# Patient Record
Sex: Female | Born: 1950 | Race: White | Hispanic: No | Marital: Married | Smoking: Never smoker
Health system: Southern US, Community
[De-identification: ages and names within clinical notes are randomized; demographics above are authoritative.]

## PROBLEM LIST (undated history)

## (undated) DIAGNOSIS — I10 Essential (primary) hypertension: Secondary | ICD-10-CM

## (undated) DIAGNOSIS — G4730 Sleep apnea, unspecified: Secondary | ICD-10-CM

## (undated) DIAGNOSIS — E785 Hyperlipidemia, unspecified: Secondary | ICD-10-CM

## (undated) DIAGNOSIS — J189 Pneumonia, unspecified organism: Secondary | ICD-10-CM

## (undated) DIAGNOSIS — J45909 Unspecified asthma, uncomplicated: Secondary | ICD-10-CM

## (undated) HISTORY — PX: BREAST SURGERY: SHX581

## (undated) HISTORY — PX: BACK SURGERY: SHX140

---

## 2010-04-16 ENCOUNTER — Emergency Department (HOSPITAL_BASED_OUTPATIENT_CLINIC_OR_DEPARTMENT_OTHER): Admission: EM | Admit: 2010-04-16 | Discharge: 2010-04-16 | Payer: Self-pay | Admitting: Emergency Medicine

## 2016-07-09 ENCOUNTER — Encounter (HOSPITAL_BASED_OUTPATIENT_CLINIC_OR_DEPARTMENT_OTHER): Payer: Self-pay

## 2016-07-09 ENCOUNTER — Emergency Department (HOSPITAL_BASED_OUTPATIENT_CLINIC_OR_DEPARTMENT_OTHER)
Admission: EM | Admit: 2016-07-09 | Discharge: 2016-07-09 | Disposition: A | Discharge status: Other Acute Inpatient Hospital | Payer: Medicare Other | Attending: Emergency Medicine | Admitting: Emergency Medicine

## 2016-07-09 ENCOUNTER — Emergency Department (HOSPITAL_BASED_OUTPATIENT_CLINIC_OR_DEPARTMENT_OTHER): Payer: Medicare Other

## 2016-07-09 DIAGNOSIS — J111 Influenza due to unidentified influenza virus with other respiratory manifestations: Secondary | ICD-10-CM | POA: Insufficient documentation

## 2016-07-09 DIAGNOSIS — R0902 Hypoxemia: Secondary | ICD-10-CM

## 2016-07-09 DIAGNOSIS — J45909 Unspecified asthma, uncomplicated: Secondary | ICD-10-CM | POA: Insufficient documentation

## 2016-07-09 DIAGNOSIS — Z7982 Long term (current) use of aspirin: Secondary | ICD-10-CM | POA: Diagnosis not present

## 2016-07-09 DIAGNOSIS — J189 Pneumonia, unspecified organism: Secondary | ICD-10-CM | POA: Diagnosis not present

## 2016-07-09 DIAGNOSIS — R0602 Shortness of breath: Secondary | ICD-10-CM | POA: Diagnosis present

## 2016-07-09 DIAGNOSIS — Z791 Long term (current) use of non-steroidal anti-inflammatories (NSAID): Secondary | ICD-10-CM | POA: Diagnosis not present

## 2016-07-09 DIAGNOSIS — Z79899 Other long term (current) drug therapy: Secondary | ICD-10-CM | POA: Insufficient documentation

## 2016-07-09 HISTORY — DX: Unspecified asthma, uncomplicated: J45.909

## 2016-07-09 HISTORY — DX: Sleep apnea, unspecified: G47.30

## 2016-07-09 LAB — BASIC METABOLIC PANEL
ANION GAP: 8 (ref 5–15)
BUN: 24 mg/dL — ABNORMAL HIGH (ref 6–20)
CALCIUM: 8.9 mg/dL (ref 8.9–10.3)
CO2: 33 mmol/L — AB (ref 22–32)
Chloride: 94 mmol/L — ABNORMAL LOW (ref 101–111)
Creatinine, Ser: 0.8 mg/dL (ref 0.44–1.00)
GFR calc non Af Amer: >60 mL/min (ref >60)
Glucose, Bld: 106 mg/dL — ABNORMAL HIGH (ref 65–99)
Potassium: 3.6 mmol/L (ref 3.5–5.1)
Sodium: 135 mmol/L (ref 135–145)

## 2016-07-09 LAB — CBC WITH DIFFERENTIAL/PLATELET
BASOS ABS: 0 10*3/uL (ref 0.0–0.1)
Basophils Relative: 0 %
Eosinophils Absolute: 0.1 10*3/uL (ref 0.0–0.7)
Eosinophils Relative: 1 %
HEMATOCRIT: 38.5 % (ref 36.0–46.0)
HEMOGLOBIN: 11.9 g/dL — AB (ref 12.0–15.0)
Lymphocytes Relative: 7 %
Lymphs Abs: 0.9 10*3/uL (ref 0.7–4.0)
MCH: 30.6 pg (ref 26.0–34.0)
MCHC: 30.9 g/dL (ref 30.0–36.0)
MCV: 99 fL (ref 78.0–100.0)
Monocytes Absolute: 0.6 10*3/uL (ref 0.1–1.0)
Monocytes Relative: 5 %
NEUTROS ABS: 10.9 10*3/uL — AB (ref 1.7–7.7)
NEUTROS PCT: 87 %
Platelets: 249 10*3/uL (ref 150–400)
RBC: 3.89 MIL/uL (ref 3.87–5.11)
RDW: 13.5 % (ref 11.5–15.5)
WBC: 12.6 10*3/uL — ABNORMAL HIGH (ref 4.0–10.5)

## 2016-07-09 LAB — INFLUENZA PANEL BY PCR (TYPE A & B)
INFLAPCR: NEGATIVE
INFLBPCR: NEGATIVE

## 2016-07-09 LAB — I-STAT CG4 LACTIC ACID, ED: Lactic Acid, Venous: 1.09 mmol/L (ref 0.5–1.9)

## 2016-07-09 MED ORDER — SODIUM CHLORIDE 0.9 % IV BOLUS (SEPSIS)
1000.0000 mL | Freq: Once | INTRAVENOUS | Status: AC
  Administered 2016-07-09: 1000 mL via INTRAVENOUS

## 2016-07-09 MED ORDER — ACETAMINOPHEN 500 MG PO TABS
500.0000 mg | ORAL_TABLET | Freq: Once | ORAL | Status: AC
  Administered 2016-07-09: 500 mg via ORAL
  Filled 2016-07-09: qty 1

## 2016-07-09 MED ORDER — OSELTAMIVIR PHOSPHATE 75 MG PO CAPS
75.0000 mg | ORAL_CAPSULE | Freq: Once | ORAL | Status: AC
  Administered 2016-07-09: 75 mg via ORAL
  Filled 2016-07-09: qty 1

## 2016-07-09 MED ORDER — AZITHROMYCIN 500 MG IV SOLR
500.0000 mg | Freq: Once | INTRAVENOUS | Status: AC
  Administered 2016-07-09: 500 mg via INTRAVENOUS

## 2016-07-09 MED ORDER — ALBUTEROL SULFATE (2.5 MG/3ML) 0.083% IN NEBU
5.0000 mg | INHALATION_SOLUTION | Freq: Once | RESPIRATORY_TRACT | Status: AC
  Administered 2016-07-09: 5 mg via RESPIRATORY_TRACT
  Filled 2016-07-09: qty 6

## 2016-07-09 MED ORDER — AZITHROMYCIN 500 MG IV SOLR
INTRAVENOUS | Status: AC
  Filled 2016-07-09: qty 500

## 2016-07-09 MED ORDER — IPRATROPIUM BROMIDE 0.02 % IN SOLN
0.5000 mg | Freq: Once | RESPIRATORY_TRACT | Status: AC
  Administered 2016-07-09: 0.5 mg via RESPIRATORY_TRACT
  Filled 2016-07-09: qty 2.5

## 2016-07-09 MED ORDER — CEFTRIAXONE SODIUM 1 G IJ SOLR
1.0000 g | Freq: Once | INTRAMUSCULAR | Status: AC
  Administered 2016-07-09: 1 g via INTRAVENOUS
  Filled 2016-07-09: qty 10

## 2016-07-09 NOTE — ED Provider Notes (Signed)
[facility] Provider Note   CSN: [id number] Arrival date & time: 07/09/16  1152     History   Chief Complaint Chief Complaint  Patient presents with  . Shortness of Breath    HPI Terri Fowler is a 66 y.o. female.  HPI Patient was sent in from her pulmonologist office. Since her shortness of breath and cough. Has had cough with brown sputum for the last 3-4 days. States began after a sleep study. Had sats of 83% in the pulmonologist office. Has no known fevers but found afebrile here. No sick contacts. No chest pain.   Past Medical History:  Diagnosis Date  . Asthma   . Sleep apnea     There are no active problems to display for this patient.   No past surgical history on file.  OB History    No data available       Home Medications    Prior to Admission medications   Medication Sig Start Date End Date Taking? Authorizing Provider  amLODipine (NORVASC) 10 MG tablet Take 10 mg by mouth daily.   Yes Historical Provider, MD  aspirin EC 81 MG tablet Take 81 mg by mouth daily.   Yes Historical Provider, MD  baclofen (LIORESAL) 10 MG tablet Take 10 mg by mouth 3 (three) times daily.   Yes Historical Provider, MD  estradiol (ESTRACE) 1 MG tablet Take 1 mg by mouth daily.   Yes Historical Provider, MD  furosemide (LASIX) 20 MG tablet Take 20 mg by mouth as needed.   Yes Historical Provider, MD  gabapentin (NEURONTIN) 600 MG tablet Take 600 mg by mouth 2 (two) times daily.   Yes Historical Provider, MD  lamoTRIgine (LAMICTAL) 100 MG tablet Take 100 mg by mouth daily.   Yes Historical Provider, MD  losartan-hydrochlorothiazide (HYZAAR) 100-25 MG tablet Take 1 tablet by mouth daily.   Yes Historical Provider, MD  medroxyPROGESTERone (PROVERA) 2.5 MG tablet Take 2.5 mg by mouth daily.   Yes Historical Provider, MD  meloxicam (MOBIC) 15 MG tablet Take 15 mg by mouth daily.   Yes Historical Provider, MD  oxybutynin (DITROPAN) 5 MG tablet Take 5 mg by mouth at  bedtime.   Yes Historical Provider, MD  pantoprazole (PROTONIX) 40 MG tablet Take 40 mg by mouth daily.   Yes Historical Provider, MD  pramipexole (MIRAPEX) 1.5 MG tablet Take 1.5 mg by mouth at bedtime.   Yes Historical Provider, MD  rosuvastatin (CRESTOR) 20 MG tablet Take 20 mg by mouth daily.   Yes Historical Provider, MD  traZODone (DESYREL) 100 MG tablet Take 100 mg by mouth at bedtime.   Yes Historical Provider, MD  venlafaxine (EFFEXOR) 75 MG tablet Take 75 mg by mouth 2 (two) times daily.   Yes Historical Provider, MD    Family History No family history on file.  Social History Social History  Substance Use Topics  . Smoking status: Not on file  . Smokeless tobacco: Not on file  . Alcohol use Not on file     Allergies   Codeine; Demerol [meperidine]; and Morphine and related   Review of Systems Review of Systems  Constitutional: Positive for chills. Negative for appetite change.  HENT: Positive for congestion.   Respiratory: Positive for cough, shortness of breath and wheezing.   Cardiovascular: Negative for chest pain.  Gastrointestinal: Negative for abdominal pain.  Genitourinary: Negative for dysuria.  Musculoskeletal: Negative for back pain.  Skin: Negative for wound.  Neurological: Negative for numbness.  Hematological: Negative for adenopathy.  Psychiatric/Behavioral: Negative for behavioral problems.     Physical Exam Updated Vital Signs BP 110/60   Pulse 101   Temp 102.2 F (39 C) (Oral)   Resp 23   Ht 4\' 11"  (1.499 m)   Wt 200 lb (90.7 kg)   SpO2 93%   BMI 40.40 kg/m   Physical Exam  Constitutional: She appears well-developed.  HENT:  Head: Normocephalic.  Eyes: EOM are normal.  Neck: No JVD present.  Cardiovascular:  Mild tachycardia  Pulmonary/Chest:  Mildly diffuse harsh breath sounds.  Abdominal: Soft. There is no tenderness.  Musculoskeletal: She exhibits no tenderness.  Neurological: She is alert.  Skin: Skin is warm. Capillary  refill takes less than 2 seconds.     ED Treatments / Results  Labs (all labs ordered are listed, but only abnormal results are displayed) Labs Reviewed  BASIC METABOLIC PANEL - Abnormal; Notable for the following:       Result Value   Chloride 94 (*)    CO2 33 (*)    Glucose, Bld 106 (*)    BUN 24 (*)    All other components within normal limits  CBC WITH DIFFERENTIAL/PLATELET - Abnormal; Notable for the following:    WBC 12.6 (*)    Hemoglobin 11.9 (*)    Neutro Abs 10.9 (*)    All other components within normal limits  INFLUENZA PANEL BY PCR (TYPE A & B)  I-STAT CG4 LACTIC ACID, ED    EKG  EKG Interpretation None       Radiology Dg Chest 2 View  Result Date: 07/09/2016 CLINICAL DATA:  Cough.  Shortness of breath . EXAM: CHEST  2 VIEW COMPARISON:  03/25/2016. FINDINGS: Mediastinum and hilar structures normal heart size stable. Mild bilateral from interstitial prominence. Mild pneumonitis cannot be excluded. No pleural effusion or pneumothorax. Prior cervicothoracic spine fusion. IMPRESSION: Mild bilateral from interstitial prominence. Mild pneumonitis cannot be excluded. Electronically Signed   By: Maisie Fus  Register   On: 07/09/2016 12:24    Procedures Procedures (including critical care time)  Medications Ordered in ED Medications  cefTRIAXone (ROCEPHIN) 1 g in dextrose 5 % 50 mL IVPB (1 g Intravenous New Bag/Given 07/09/16 1408)  azithromycin (ZITHROMAX) 500 mg in dextrose 5 % 250 mL IVPB (not administered)  azithromycin (ZITHROMAX) 500 MG injection (not administered)  albuterol (PROVENTIL) (2.5 MG/3ML) 0.083% nebulizer solution 5 mg (5 mg Nebulization Given 07/09/16 1222)  ipratropium (ATROVENT) nebulizer solution 0.5 mg (0.5 mg Nebulization Given 07/09/16 1222)  acetaminophen (TYLENOL) tablet 500 mg (500 mg Oral Given 07/09/16 1218)  sodium chloride 0.9 % bolus 1,000 mL (0 mLs Intravenous Stopped 07/09/16 1408)  oseltamivir (TAMIFLU) capsule 75 mg (75 mg Oral Given  07/09/16 1421)     Initial Impression / Assessment and Plan / ED Course  I have reviewed the triage vital signs and the nursing notes.  Pertinent labs & imaging results that were available during my care of the patient were reviewed by me and considered in my medical decision making (see chart for details).     Patient shortness of breath and cough. Initially hypoxic with sats in the mid 80s. X-ray showed possible pneumonitis. History of asthma. Does not sound wheezy. Has had fevers with increased sputum production. Will give antibiotics but also will send flu test and empirically treat with initial dose of Tamiflu. Continued mild hypoxia in room air but tolerates 2 L of oxygen well. Will admit to [facility]. Discussed with  Dr.Nsiah  Final Clinical Impressions(s) / ED Diagnoses   Final diagnoses:  Hypoxia  Influenza  Community acquired pneumonia, unspecified laterality    New Prescriptions New Prescriptions   No medications on file     Benjiman Core, MD 07/09/16 1427

## 2016-07-09 NOTE — ED Notes (Signed)
Pt transported to [facility] by [facility]

## 2016-07-09 NOTE — ED Notes (Signed)
Patient transported to X-ray 

## 2016-07-09 NOTE — ED Notes (Signed)
[facility]

## 2016-07-09 NOTE — ED Triage Notes (Signed)
Pt c/o "laryngeal spasm" started 2 days ago after sleep study-sent from pulm office-pt unaware of fever-NAD-presents to triage in w/c

## 2016-07-09 NOTE — ED Notes (Signed)
Attempt to call report nurse not available 

## 2018-06-12 IMAGING — DX DG CHEST 2V
2 series · 2 of 2 positions shown · non-contrast
Comparison: 03/25/2016.

CLINICAL DATA: Cough.  Shortness of breath .

EXAM:
CHEST  2 VIEW

[chest pa]
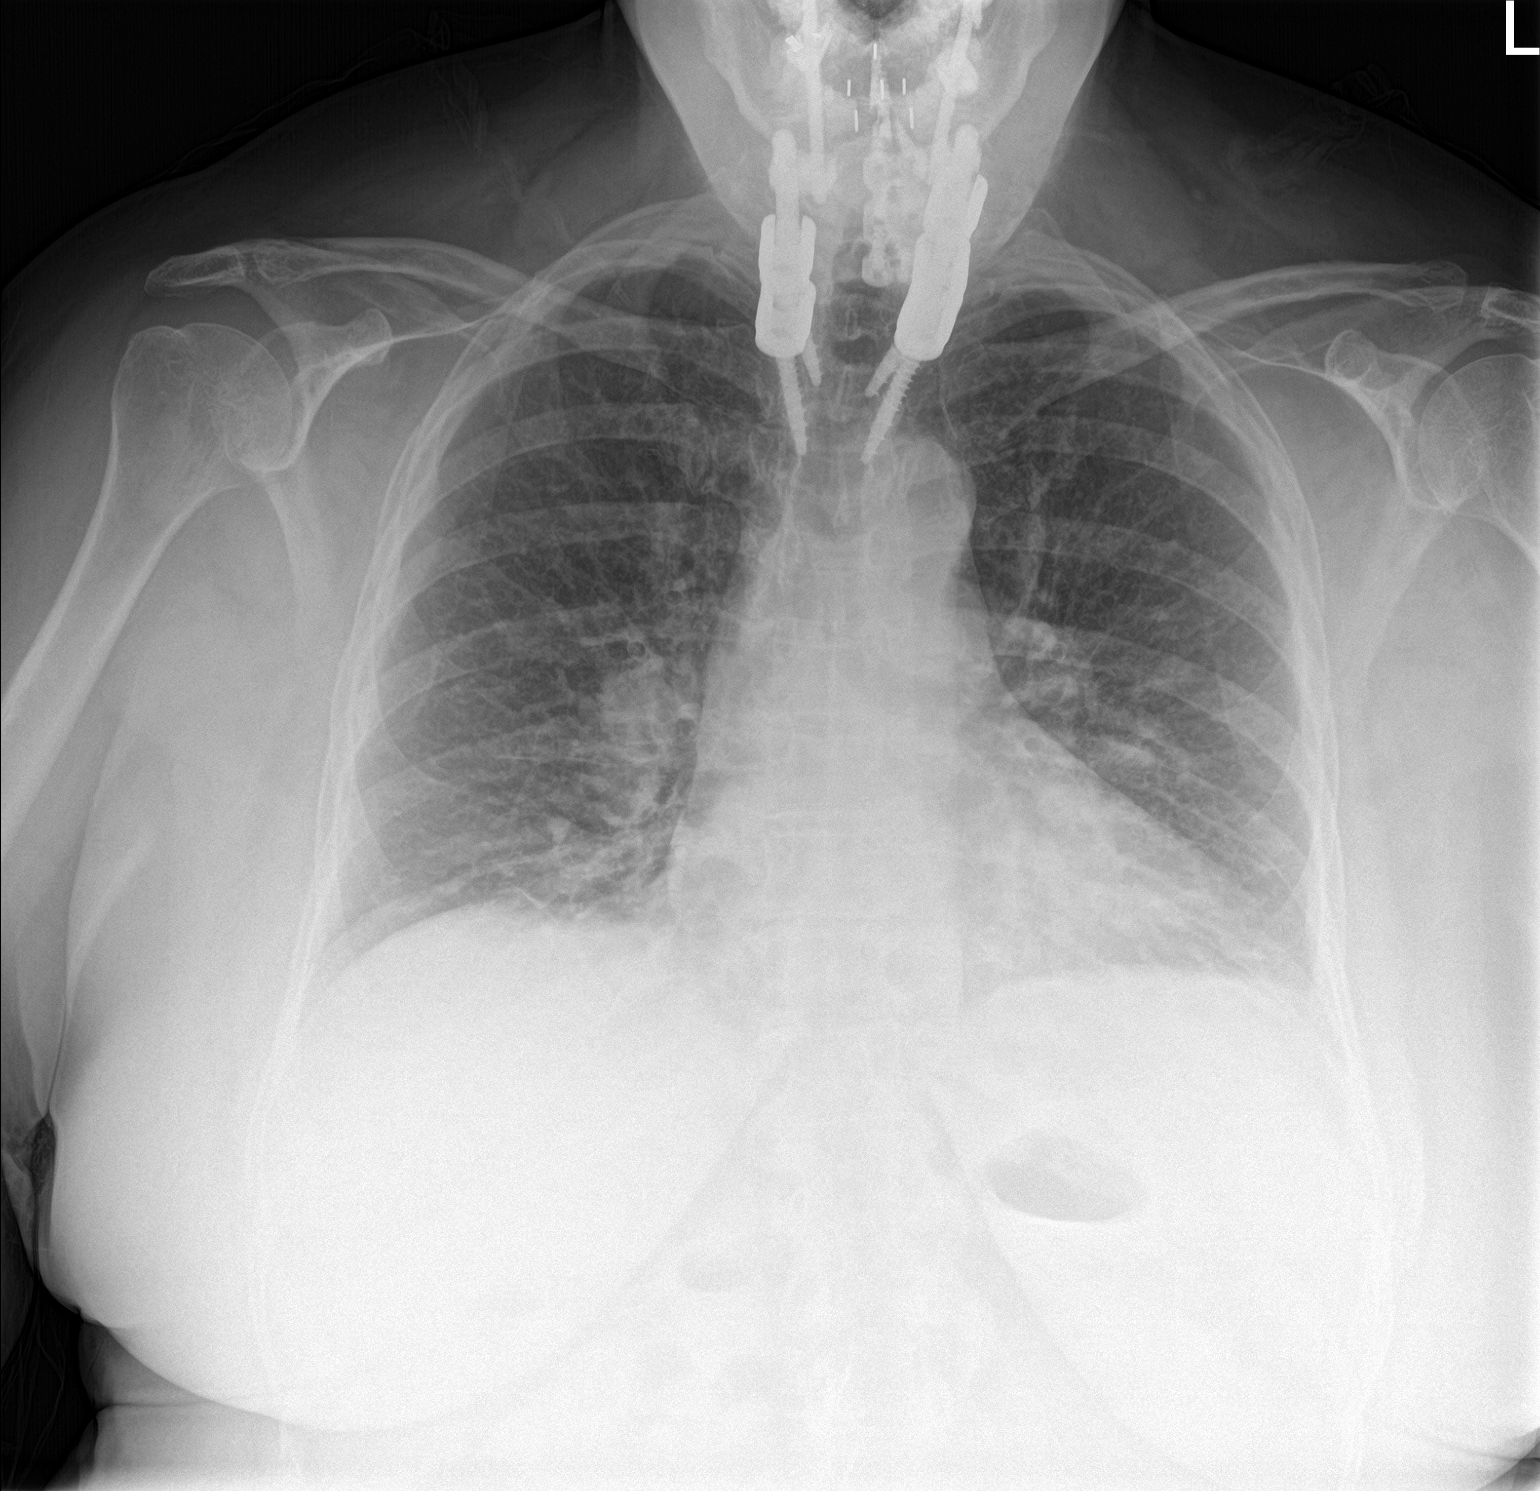

[chest lat]
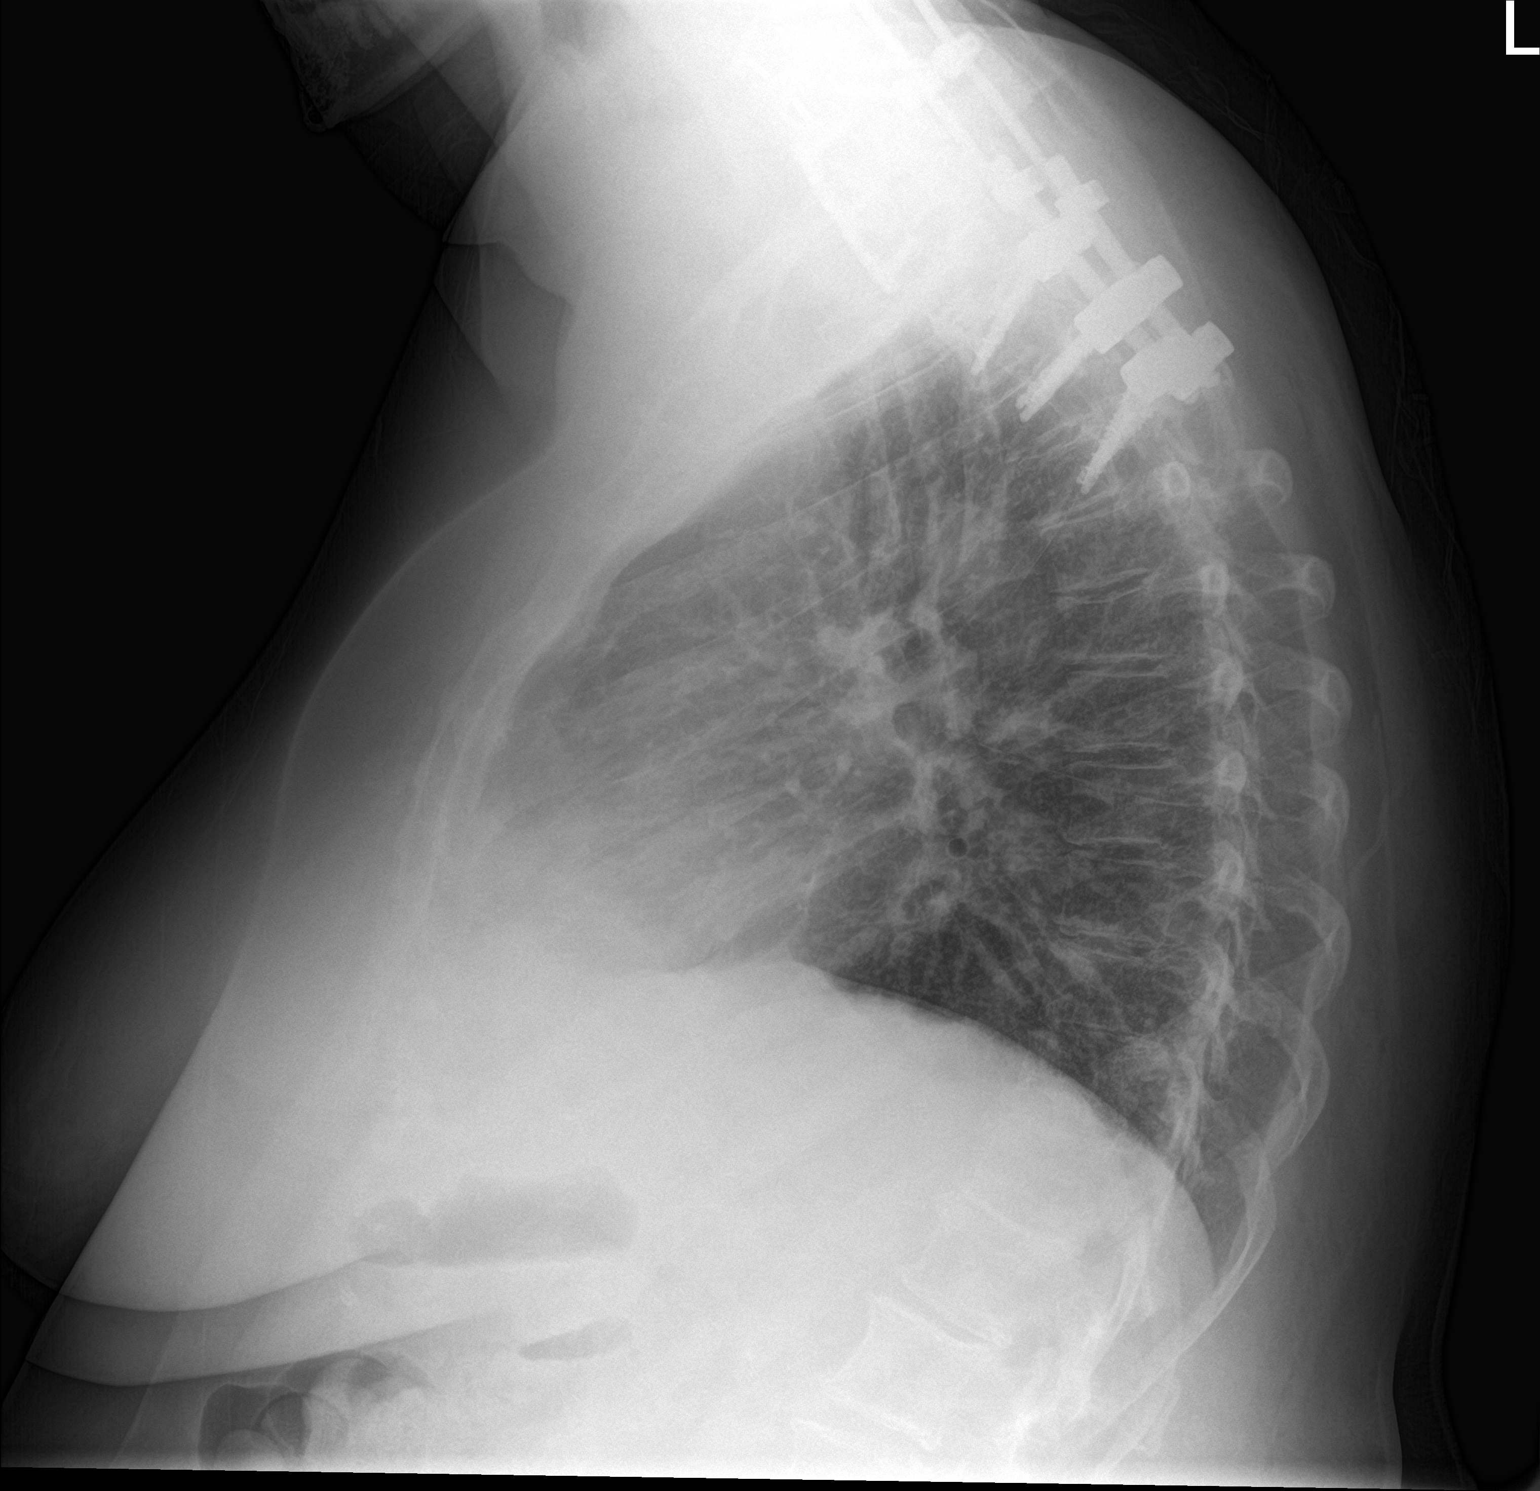

[2 of 2 positions shown; findings below may reference images not displayed]

FINDINGS: Mediastinum and hilar structures normal heart size stable. Mild
bilateral from interstitial prominence. Mild pneumonitis cannot be
excluded. No pleural effusion or pneumothorax. Prior cervicothoracic
spine fusion.
IMPRESSION: Mild bilateral from interstitial prominence. Mild pneumonitis cannot
be excluded.

## 2019-07-16 ENCOUNTER — Ambulatory Visit: Payer: Medicare Other | Attending: Internal Medicine

## 2019-07-16 DIAGNOSIS — Z23 Encounter for immunization: Secondary | ICD-10-CM | POA: Insufficient documentation

## 2019-07-16 NOTE — Progress Notes (Signed)
   Covid-19 Vaccination Clinic  Name:  Terri Fowler    MRN: [id number] DOB: 02-20-1951  07/16/2019  [name] was observed post Covid-19 immunization for 30 minutes based on pre-vaccination screening without incidence. She was provided with Vaccine Information Sheet and instruction to access the V-Safe system.   [name] was instructed to call 911 with any severe reactions post vaccine: Marland Kitchen Difficulty breathing  . Swelling of your face and throat  . A fast heartbeat  . A bad rash all over your body  . Dizziness and weakness    Immunizations Administered    Name Date Dose VIS Date Route   Pfizer COVID-19 Vaccine 07/16/2019 11:31 AM 0.3 mL 05/05/2019 Intramuscular   Manufacturer: [employer], [employer]   Lot: [id number]   NDC: [address]-1

## 2019-08-09 ENCOUNTER — Ambulatory Visit: Payer: Medicare Other | Attending: Internal Medicine

## 2019-08-09 DIAGNOSIS — Z23 Encounter for immunization: Secondary | ICD-10-CM

## 2019-08-09 NOTE — Progress Notes (Signed)
   Covid-19 Vaccination Clinic  Name:  Kathlean Cinco    MRN: [id number] DOB: 12-Nov-1950  08/09/2019  Ms. Ridlon was observed post Covid-19 immunization for 15 minutes without incident. She was provided with Vaccine Information Sheet and instruction to access the V-Safe system.   Ms. Forde was instructed to call 911 with any severe reactions post vaccine: Marland Kitchen Difficulty breathing  . Swelling of face and throat  . A fast heartbeat  . A bad rash all over body  . Dizziness and weakness   Immunizations Administered    Name Date Dose VIS Date Route   Pfizer COVID-19 Vaccine 08/09/2019  1:02 PM 0.3 mL 05/05/2019 Intramuscular   Manufacturer: [employer], [employer]   Lot: [id number]   NDC: [address]-0

## 2022-08-23 ENCOUNTER — Emergency Department (HOSPITAL_BASED_OUTPATIENT_CLINIC_OR_DEPARTMENT_OTHER): Payer: Medicare Other

## 2022-08-23 ENCOUNTER — Encounter (HOSPITAL_BASED_OUTPATIENT_CLINIC_OR_DEPARTMENT_OTHER): Payer: Self-pay

## 2022-08-23 ENCOUNTER — Emergency Department (HOSPITAL_BASED_OUTPATIENT_CLINIC_OR_DEPARTMENT_OTHER)
Admission: EM | Admit: 2022-08-23 | Discharge: 2022-08-23 | Disposition: A | Discharge status: Home / Self Care | Payer: Medicare Other | Attending: Emergency Medicine | Admitting: Emergency Medicine

## 2022-08-23 DIAGNOSIS — M25562 Pain in left knee: Secondary | ICD-10-CM | POA: Diagnosis not present

## 2022-08-23 DIAGNOSIS — N189 Chronic kidney disease, unspecified: Secondary | ICD-10-CM | POA: Insufficient documentation

## 2022-08-23 DIAGNOSIS — I129 Hypertensive chronic kidney disease with stage 1 through stage 4 chronic kidney disease, or unspecified chronic kidney disease: Secondary | ICD-10-CM | POA: Insufficient documentation

## 2022-08-23 DIAGNOSIS — J45909 Unspecified asthma, uncomplicated: Secondary | ICD-10-CM | POA: Diagnosis not present

## 2022-08-23 DIAGNOSIS — M25561 Pain in right knee: Secondary | ICD-10-CM | POA: Diagnosis present

## 2022-08-23 HISTORY — DX: Pneumonia, unspecified organism: J18.9

## 2022-08-23 HISTORY — DX: Essential (primary) hypertension: I10

## 2022-08-23 HISTORY — DX: Hyperlipidemia, unspecified: E78.5

## 2022-08-23 MED ORDER — HYDROCODONE-ACETAMINOPHEN 5-325 MG PO TABS
1.0000 | ORAL_TABLET | Freq: Once | ORAL | Status: AC
  Administered 2022-08-23: 1 via ORAL
  Filled 2022-08-23: qty 1

## 2022-08-23 NOTE — Discharge Instructions (Signed)
Follow-up with your orthopedic doctor and primary care doctor.  Your ultrasound did not show signs of a DVT.  If you have persistent swelling they may want to repeat this in about a week.  Come back if you have redness, swelling, increased pain or other worrisome changes.

## 2022-08-23 NOTE — ED Triage Notes (Signed)
Pt reports fluid drawn off right knee on Thursday, pt reports swelling began again yesterday and moved up leg. Ambulatory to triage. Pt reports taking tylenol for pain

## 2022-08-23 NOTE — ED Notes (Signed)
ED MD in to see

## 2022-08-23 NOTE — ED Notes (Signed)
ED Provider [place] speaking with patient at nurses station.

## 2022-08-23 NOTE — ED Notes (Signed)
ED Provider at bedside. 

## 2022-08-23 NOTE — ED Notes (Signed)
Pt wants to speak with 2nd provider.

## 2022-08-23 NOTE — ED Provider Notes (Signed)
[place] [facility] Provider Note   CSN: [id number] Arrival date & time: 08/23/22  1519     History  Chief Complaint  Patient presents with   Knee Pain    Terri Fowler is a 72 y.o. female.  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 Patient reports that she was seen by orthopedics on 3/28 for bilateral knee pain and swelling, had right knee arthrocentesis with injection of lidocaine and Toradol.  She states initially she is feeling better but since yesterday for increased pain and swelling of the right leg.  She called orthopedics today and was advised to come into the ER to have ultrasound to rule out a DVT because her whole leg was not swollen rather than just the knee.  She is able to bend the knee but states it is painful, she denies fevers or chills.  No redness.  She denies any injury or trauma to the knee.  =   Knee Pain      Home Medications Prior to Admission medications   Medication Sig Start Date End Date Taking? Authorizing Provider  amLODipine (NORVASC) 10 MG tablet Take 10 mg by mouth daily.    [provider]  aspirin EC 81 MG tablet Take 81 mg by mouth daily.    [provider]  baclofen (LIORESAL) 10 MG tablet Take 10 mg by mouth 3 (three) times daily.    [provider]  estradiol (ESTRACE) 1 MG tablet Take 1 mg by mouth daily.    [provider]  furosemide (LASIX) 20 MG tablet Take 20 mg by mouth as needed.    [provider]  gabapentin (NEURONTIN) 600 MG tablet Take 600 mg by mouth 2 (two) times daily.    [provider]  lamoTRIgine (LAMICTAL) 100 MG tablet Take 100 mg by mouth daily.    [provider]  losartan-hydrochlorothiazide (HYZAAR) 100-25 MG tablet Take 1 tablet by mouth daily.    [provider]  medroxyPROGESTERone (PROVERA) 2.5 MG tablet Take 2.5 mg by mouth daily.    [provider]  meloxicam (MOBIC) 15 MG tablet Take 15 mg by mouth daily.    [provider]  oxybutynin (DITROPAN) 5 MG tablet Take 5 mg by mouth at bedtime.    [provider]  pantoprazole (PROTONIX) 40 MG tablet Take 40 mg by mouth daily.    [provider]  pramipexole (MIRAPEX) 1.5 MG tablet Take 1.5 mg by mouth at bedtime.    [provider]  rosuvastatin (CRESTOR) 20 MG tablet Take 20 mg by mouth daily.    [provider]  traZODone (DESYREL) 100 MG tablet Take 100 mg by mouth at bedtime.    [provider]  venlafaxine (EFFEXOR) 75 MG tablet Take 75 mg by mouth 2 (two) times daily.    [provider]      Allergies    Codeine, Demerol [meperidine], and Morphine and related    Review of Systems   Review of Systems  Physical Exam Updated Vital Signs BP 138/61 (BP Location: Left Arm)   Pulse 88   Temp 98 F (36.7 C) (Oral)   Resp 17   Ht 4\' 11"  (1.499 m)   Wt 87.5 kg   SpO2 95%   BMI 38.98 kg/m  Physical Exam Vitals and nursing note reviewed.  Constitutional:  General: She is not in acute distress.    Appearance: She is well-developed.  HENT:     Head: Normocephalic and atraumatic.     Mouth/Throat:     Mouth: Mucous membranes are moist.  Eyes:     Conjunctiva/sclera: Conjunctivae normal.  Cardiovascular:     Rate and Rhythm: Normal rate and regular rhythm.     Heart sounds: No murmur heard. Pulmonary:     Effort: Pulmonary effort is normal. No respiratory distress.     Breath sounds: Normal breath sounds.  Abdominal:     Palpations: Abdomen is soft.     Tenderness: There is no abdominal tenderness.  Musculoskeletal:        General: No swelling.     Cervical back: Neck supple.     Comments: Mild diffuse swelling to right lower extremity, DP and PT pulses are 2+, capillary fill is brisk.  Sensation intact  throughout the right lower extremity.  Patient can flex and extend right knee but has pain on flexion past about 45 degrees.  There is mild effusion.  No redness or warmth.  Mild calf tenderness and mild tenderness over medial thigh.  Skin:    General: Skin is warm and dry.     Capillary Refill: Capillary refill takes less than 2 seconds.  Neurological:     General: No focal deficit present.     Mental Status: She is alert and oriented to person, place, and time.  Psychiatric:        Mood and Affect: Mood normal.     ED Results / Procedures / Treatments   Labs (all labs ordered are listed, but only abnormal results are displayed) Labs Reviewed - No data to display  EKG None  Radiology US Venous Img Lower Right (DVT Study)  Result Date: 08/23/2022 CLINICAL DATA:  Pain.  Swelling. EXAM: RIGHT LOWER EXTREMITY VENOUS DOPPLER ULTRASOUND TECHNIQUE: Gray-scale sonography with graded compression, as well as color Doppler and duplex ultrasound were performed to evaluate the lower extremity deep venous systems from the level of the common femoral vein and including the common femoral, femoral, profunda femoral, popliteal and calf veins including the posterior tibial, peroneal and gastrocnemius veins when visible. The superficial great saphenous vein was also interrogated. Spectral Doppler was utilized to evaluate flow at rest and with distal augmentation maneuvers in the common femoral, femoral and popliteal veins. COMPARISON:  None Available. FINDINGS: Contralateral Common Femoral Vein: Respiratory phasicity is normal and symmetric with the symptomatic side. No evidence of thrombus. Normal compressibility. Common Femoral Vein: No evidence of thrombus. Normal compressibility, respiratory phasicity and response to augmentation. Saphenofemoral Junction: No evidence of thrombus. Normal compressibility and flow on color Doppler imaging. Profunda Femoral Vein: No evidence of thrombus. Normal compressibility  and flow on color Doppler imaging. Femoral Vein: Poorly visualized on grayscale imaging, but were without evidence of thrombus on color Doppler evaluation. Popliteal Vein: No evidence of thrombus. Normal compressibility, respiratory phasicity and response to augmentation. Calf Veins: Poorly visualized on grayscale imaging, but were without evidence of thrombus on color Doppler evaluation. Superficial Great Saphenous Vein: No evidence of thrombus. Normal compressibility. Venous Reflux:  None. Other Findings:  None. IMPRESSION: No evidence of deep venous thrombosis. Electronically Signed   By: Marin Roberts M.D.   On: 08/23/2022 17:01   DG Knee Complete 4 Views Right  Result Date: 08/23/2022 CLINICAL DATA:  Recent fluid aspiration.  Pain and swelling. EXAM: RIGHT KNEE - COMPLETE 4+ VIEW COMPARISON:  None Available. FINDINGS:  Knee joint effusion is present. There is evidence of osteoarthritis of the patellofemoral joint and the medial weight-bearing compartment, with joint space narrowing and marginal osteophytes. No acute bone finding. IMPRESSION: Joint effusion. Patellofemoral and medial weight-bearing osteoarthritis. Electronically Signed   By: Nelson Chimes M.D.   On: 08/23/2022 16:20    Procedures Procedures    Medications Ordered in ED Medications  HYDROcodone-acetaminophen (NORCO/VICODIN) 5-325 MG per tablet 1 tablet (1 tablet Oral Given 08/23/22 1744)    ED Course/ Medical Decision Making/ A&P                             Medical Decision Making This patient presents to the ED for concern of R pain and leg swelling, this involves an extensive number of treatment options, and is a complaint that carries with it a high risk of complications and morbidity.  The differential diagnosis includes DVT, abscess, septic arthritis, other   Co morbidities that complicate the patient evaluation  CHF, arthritis   Additional history obtained:  Additional history obtained from EMR External records  from outside source obtained and reviewed including outpatient ortho note     Imaging Studies ordered:  DVT study negative on right lower extremity      Problem List / ED Course / Critical interventions / Medication management  Presents with right lower extremity swelling, told to come in by [facility] to rule out DVT.  Exam does show some asymmetric swelling of right lower extremity and patient has some mild left calf tenderness.  There is no redness or warmth,.  Pulses are intact, sensation intact.  No sign of septic arthritis.  DVT study is negative.  Discussed with patient negative results, advised on outpatient follow-up and strict return precautions. I ordered medication including norco for pain  Reevaluation of the patient after these medicines showed that the patient improved I have reviewed the patients home medicines and have made adjustments as needed       Amount and/or Complexity of Data Reviewed Radiology: ordered.  Risk Prescription drug management.          Final Clinical Impression(s) / ED Diagnoses Final diagnoses:  Acute pain of right knee    Rx / DC Orders ED Discharge Orders     None         Darci Current 08/23/22 1821    Tegeler, Gwenyth Allegra, MD 08/23/22 2041

## 2022-08-23 NOTE — ED Notes (Signed)
Pt transported to US

## 2024-01-19 ENCOUNTER — Other Ambulatory Visit: Payer: Self-pay | Admitting: Physician Assistant

## 2024-01-19 DIAGNOSIS — M5412 Radiculopathy, cervical region: Secondary | ICD-10-CM

## 2024-01-19 DIAGNOSIS — M47812 Spondylosis without myelopathy or radiculopathy, cervical region: Secondary | ICD-10-CM

## 2024-01-31 NOTE — Discharge Instructions (Signed)

## 2024-02-01 ENCOUNTER — Other Ambulatory Visit: Payer: Self-pay | Admitting: Physician Assistant

## 2024-02-01 ENCOUNTER — Ambulatory Visit
Admission: RE | Admit: 2024-02-01 | Discharge: 2024-02-01 | Disposition: A | Discharge status: Home / Self Care | Source: Ambulatory Visit | Attending: Physician Assistant | Admitting: Physician Assistant

## 2024-02-01 ENCOUNTER — Inpatient Hospital Stay
Admission: RE | Admit: 2024-02-01 | Discharge: 2024-02-01 | Disposition: A | Discharge status: Home / Self Care | Source: Ambulatory Visit | Attending: Physician Assistant | Admitting: Physician Assistant

## 2024-02-01 DIAGNOSIS — M47812 Spondylosis without myelopathy or radiculopathy, cervical region: Secondary | ICD-10-CM

## 2024-02-01 DIAGNOSIS — M4324 Fusion of spine, thoracic region: Secondary | ICD-10-CM

## 2024-02-01 DIAGNOSIS — M5412 Radiculopathy, cervical region: Secondary | ICD-10-CM

## 2024-02-01 DIAGNOSIS — M4724 Other spondylosis with radiculopathy, thoracic region: Secondary | ICD-10-CM

## 2024-02-01 MED ORDER — ONDANSETRON HCL 4 MG/2ML IJ SOLN: 4.0000 mg | Freq: Once | INTRAMUSCULAR | Status: DC | PRN

## 2024-02-01 MED ORDER — DIAZEPAM 5 MG PO TABS: 5.0000 mg | ORAL_TABLET | Freq: Once | ORAL | Status: DC

## 2024-02-01 MED ORDER — IOPAMIDOL (ISOVUE-M 300) INJECTION 61%
10.0000 mL | Freq: Once | INTRAMUSCULAR | Status: AC | PRN
  Administered 2024-02-01: 10 mL via INTRATHECAL
